# Patient Record
Sex: Female | Born: 1965
Health system: Southern US, Community
[De-identification: ages and names within clinical notes are randomized; demographics above are authoritative.]

## PROBLEM LIST (undated history)

## (undated) DIAGNOSIS — J309 Allergic rhinitis, unspecified: Secondary | ICD-10-CM

## (undated) DIAGNOSIS — R55 Syncope and collapse: Secondary | ICD-10-CM

## (undated) DIAGNOSIS — E039 Hypothyroidism, unspecified: Secondary | ICD-10-CM

## (undated) HISTORY — DX: Hypothyroidism, unspecified: E03.9

## (undated) HISTORY — DX: Syncope and collapse: R55

## (undated) HISTORY — PX: BREAST EXCISIONAL BIOPSY: SUR124

## (undated) HISTORY — DX: Allergic rhinitis, unspecified: J30.9

---
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
## 1997-10-13 ENCOUNTER — Inpatient Hospital Stay (HOSPITAL_COMMUNITY): Admission: AD | Admit: 1997-10-13 | Discharge: 1997-10-15 | Payer: Self-pay | Admitting: Obstetrics and Gynecology

## 1997-11-27 ENCOUNTER — Other Ambulatory Visit: Admission: RE | Admit: 1997-11-27 | Discharge: 1997-11-27 | Payer: Self-pay | Admitting: Obstetrics and Gynecology

## 1998-02-18 ENCOUNTER — Encounter (HOSPITAL_COMMUNITY)
Admission: RE | Admit: 1998-02-18 | Discharge: 1998-05-19 | Payer: Self-pay | Admitting: Physical Medicine & Rehabilitation

## 1999-05-27 ENCOUNTER — Other Ambulatory Visit: Admission: RE | Admit: 1999-05-27 | Discharge: 1999-05-27 | Payer: Self-pay | Admitting: Obstetrics and Gynecology

## 2000-12-22 ENCOUNTER — Other Ambulatory Visit: Admission: RE | Admit: 2000-12-22 | Discharge: 2000-12-22 | Payer: Self-pay | Admitting: Obstetrics and Gynecology

## 2002-02-20 ENCOUNTER — Other Ambulatory Visit: Admission: RE | Admit: 2002-02-20 | Discharge: 2002-02-20 | Payer: Self-pay | Admitting: Obstetrics and Gynecology

## 2002-08-20 ENCOUNTER — Inpatient Hospital Stay (HOSPITAL_COMMUNITY): Admission: AD | Admit: 2002-08-20 | Discharge: 2002-08-24 | Payer: Self-pay | Admitting: *Deleted

## 2002-09-27 ENCOUNTER — Encounter: Admission: RE | Admit: 2002-09-27 | Discharge: 2003-02-04 | Payer: Self-pay | Admitting: Obstetrics and Gynecology

## 2002-10-14 ENCOUNTER — Other Ambulatory Visit: Admission: RE | Admit: 2002-10-14 | Discharge: 2002-10-14 | Payer: Self-pay | Admitting: Obstetrics and Gynecology

## 2003-11-13 ENCOUNTER — Other Ambulatory Visit: Admission: RE | Admit: 2003-11-13 | Discharge: 2003-11-13 | Payer: Self-pay | Admitting: Obstetrics and Gynecology

## 2004-06-03 ENCOUNTER — Other Ambulatory Visit: Admission: RE | Admit: 2004-06-03 | Discharge: 2004-06-03 | Payer: Self-pay | Admitting: Obstetrics and Gynecology

## 2004-12-07 ENCOUNTER — Other Ambulatory Visit: Admission: RE | Admit: 2004-12-07 | Discharge: 2004-12-07 | Payer: Self-pay | Admitting: Obstetrics and Gynecology

## 2005-06-06 ENCOUNTER — Encounter: Admission: RE | Admit: 2005-06-06 | Discharge: 2005-06-06 | Payer: Self-pay | Admitting: Obstetrics and Gynecology

## 2008-08-27 ENCOUNTER — Ambulatory Visit: Payer: Self-pay | Admitting: Internal Medicine

## 2009-05-26 ENCOUNTER — Encounter: Admission: RE | Admit: 2009-05-26 | Discharge: 2009-05-26 | Payer: Self-pay | Admitting: Obstetrics and Gynecology

## 2010-08-20 ENCOUNTER — Other Ambulatory Visit: Payer: Self-pay | Admitting: Obstetrics and Gynecology

## 2010-08-20 DIAGNOSIS — Z1239 Encounter for other screening for malignant neoplasm of breast: Secondary | ICD-10-CM

## 2010-09-13 ENCOUNTER — Ambulatory Visit
Admission: RE | Admit: 2010-09-13 | Discharge: 2010-09-13 | Disposition: A | Discharge status: Home / Self Care | Payer: 59 | Source: Ambulatory Visit | Attending: Obstetrics and Gynecology | Admitting: Obstetrics and Gynecology

## 2010-09-13 DIAGNOSIS — Z1239 Encounter for other screening for malignant neoplasm of breast: Secondary | ICD-10-CM

## 2010-09-28 ENCOUNTER — Institutional Professional Consult (permissible substitution): Payer: Self-pay | Admitting: Internal Medicine

## 2010-11-11 ENCOUNTER — Ambulatory Visit (INDEPENDENT_AMBULATORY_CARE_PROVIDER_SITE_OTHER): Payer: 59 | Admitting: Internal Medicine

## 2010-11-11 ENCOUNTER — Encounter: Payer: Self-pay | Admitting: Internal Medicine

## 2010-11-11 DIAGNOSIS — J45909 Unspecified asthma, uncomplicated: Secondary | ICD-10-CM

## 2010-11-11 DIAGNOSIS — J309 Allergic rhinitis, unspecified: Secondary | ICD-10-CM

## 2010-11-11 MED ORDER — BECLOMETHASONE DIPROPIONATE 40 MCG/ACT IN AERS: 2.0000 | INHALATION_SPRAY | Freq: Two times a day (BID) | RESPIRATORY_TRACT | Status: AC

## 2010-11-11 NOTE — Patient Instructions (Addendum)
Add Qvar 40 - script sent to drug store  Ok to continue using Ventolin HFA as a rescue inhaler as you have been, as needed.   Orders-   Schedule PFT

## 2010-11-11 NOTE — Progress Notes (Signed)
  Subjective:    Patient ID: Latoya Robbins, female    DOB: 01/22/1966, 45 y.o.   MRN: [id number]  HPI 79 yoF, never smoker, seen on referral from Dr Carolee Rota office to establish here for help with asthma. In the past she was under the care of Dr Delano Metz here. Asthma in childhood and was on allergy vaccine. She describes getting progressively with each pregnancy- last was 8 years ago. She has always kept one of her children's rescue inhalers around. After a bad chest cold in February she began needing to use it 1-2x/ night and tends to feel tight as she lies down. Spring pollen is causing more nasal congestion and drainage this year than usual, especially after mowing yard. Triggers also include damp/ musty areas. Home is not damp, does have short haired dog.  CXR 08/24/10 reported clear.  Works as Pensions consultant. Children with first husband have asthma. Step children do not.  Review of Systems Constitutional:   No weight loss, night sweats,  Fevers, chills, fatigue, lassitude. HEENT:   No headaches,  Difficulty swallowing,  Tooth/dental problems,  Sore throat,                CV:  No chest pain,  Orthopnea, PND, swelling in lower extremities, anasarca, dizziness, palpitations  GI  No heartburn, indigestion, abdominal pain, nausea, vomiting, diarrhea, change in bowel habits, loss of appetite  Resp: No shortness of breath with exertion or at rest.  No excess mucus, no productive cough,  No non-productive cough,  No coughing up of blood.  No change in color of mucus.   Skin: no rash or lesions.  GU: no dysuria, change in color of urine, no urgency or frequency.  No flank pain.  MS:  No joint pain or swelling.  No decreased range of motion.  No back pain.  Psych:  No change in mood or affect. No depression or anxiety.  No memory loss.      Objective:   Physical Exam General- Alert, Oriented, Affect-appropriate, Distress- none acute   Tall, thin  Skin- rash-none, lesions- none, excoriation-  none  Lymphadenopathy- none  Head- atraumatic  Eyes- Gross vision intact, PERRLA, conjunctivae clear secretions  Ears- Hearing Normal canals, Tm L ,   R ,  Nose- Clear, No-Septal dev, mucus, polyps, erosion, perforation   Throat- Mallampati II , mucosa clear , drainage- none, tonsils- atrophic  Neck- flexible , trachea midline, no stridor , thyroid nl, carotid no bruit  Chest - symmetrical excursion , unlabored     Heart/CV- RRR , no murmur , no gallop  , no rub, nl s1 s2                     - JVD- none , edema- none, stasis changes- none, varices- none     Lung- clear to P&A, wheeze- none, cough- none , dullness-none, rub- none     Chest wall-   Abd- tender-no, distended-no, bowel sounds-present, HSM- no  Br/ Gen/ Rectal- Not done, not indicated  Extrem- cyanosis- none, clubbing, none, atrophy- none, strength- nl  Neuro- grossly intact to observation          Assessment & Plan:

## 2010-11-14 ENCOUNTER — Encounter: Payer: Self-pay | Admitting: Internal Medicine

## 2010-11-14 DIAGNOSIS — J45909 Unspecified asthma, uncomplicated: Secondary | ICD-10-CM | POA: Insufficient documentation

## 2010-11-14 DIAGNOSIS — J302 Other seasonal allergic rhinitis: Secondary | ICD-10-CM | POA: Insufficient documentation

## 2010-11-14 NOTE — Assessment & Plan Note (Signed)
Mild seasonal rhinitis. An otc antihistamine should be sufficient. Some education on environmental controls and precautions.

## 2010-11-14 NOTE — Assessment & Plan Note (Addendum)
Mild persistent asthma. Trigger hx suggests allergic and non-allergic factors. We discussed rescue and maintenance therapies. A PFT will help characterize her better We will start a trial of Qvar to discuss at next visit. I don't get a significant hx of postnasal drip. GERD or tobacco exposure.

## 2010-11-17 ENCOUNTER — Encounter: Payer: Self-pay | Admitting: Internal Medicine

## 2010-12-07 NOTE — Assessment & Plan Note (Signed)
[facility]                         ELECTROPHYSIOLOGY OFFICE NOTE   Robbins, Latoya Robbins                    MRN:          [id number]  DATE:08/27/2008                            DOB:          10/06/1965    Latoya Robbins was referred today by Dr. Candice Camp for evaluation of  syncope.   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  The  patient has a history of syncope dating back to her teenage years.  She  notes that most commonly these episodes have been associated with her  menstrual cycle and that cramps and abdominal fullness, particularly in  association with urination or defecation, would be more likely to lead  to episodes of passing out.  She has never injured herself passing out  and it sounds like she has had at least a dozen episodes in the last 20-  25 years.  At other times, stressful situations like prior to the  examination for example when she sat for the bar exam and when she took  the PSAT exam, resulted in periods of syncope or near-syncope.  The  patient states that when these episodes occur, she will feel hot and  warm and get somewhat sweaty.  She feels lightheaded and weak.  Typically, she will lie down and sometimes she can avoid total syncope,  although these episodes may last for 5 or 10 minutes.  She has had 1  episode while driving where she avoided passing out completely by  stopping her car and eating some crackers.  She has never had an episode  of bowel or bladder incontinence and she does not bite her tongue when  she has these episodes.  She also notes to have an occasional episode  when she goes to church.   PAST MEDICAL HISTORY:  Notable for lump removed in her left breast in  1988.   SOCIAL HISTORY:  The patient is married.  She has 3 children.  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   FAMILY HISTORY:  Notable for her  mother who is alive and well with no  significant problems.  Her father died at age 2 after a suicide.  She  has a brother and a sister who are alive and well, though one of her  sisters has diabetes.   CURRENT MEDICATIONS:  Oral contraceptive pill.   ALLERGIES:  There is a questionable allergy to PHENERGAN.   REVIEW OF SYSTEMS:  Notable for some seasonal allergic rhinitis, but  otherwise all systems reviewed negative, except as noted in the HPI.   PHYSICAL EXAMINATION:  GENERAL:  She is a pleasant well-appearing woman  in no acute distress.  VITAL SIGNS:  Her blood pressure in the supine position today was 100/60  with a heart rate of 65.  Sitting, her blood pressure was 101/65 with a  heart rate of 68.  After several minutes of standing, the patient became  diaphoretic and felt clammy.  Her heart rate increased to 92 beats per  minute.  Her  blood pressure dropped into the 80s and she felt as though  she was about to pass out.  She laid back down with resolution of her  symptoms today.  Her respirations were 16 and the weight was 137 pounds.  HEENT:  Normocephalic and atraumatic.  Pupils are equal and round.  The  oropharynx is moist.  The sclerae are anicteric.  NECK:  No jugular venous distention.  There is no thyromegaly.  Trachea  was midline.  The carotids were 2+ and symmetric.  LUNGS:  Clear  bilaterally to auscultation.  There were no wheezes, rales, or rhonchi.  There is no increased work of breathing.  CARDIOVASCULAR:  Regular rate and rhythm.  Normal S1 and S2.  There are  no murmurs, rubs, or gallops.  PMI was not enlarged nor was it laterally  displaced.  ABDOMEN:  Soft, nontender, and nondistended.  There is no organomegaly.  Bowel sounds were present.  There is no rebound or guarding.  EXTREMITIES:  No cyanosis, clubbing, or edema.  The pulses are 2+ and  symmetric.  NEUROLOGIC:  Alert and oriented x3.  The cranial nerves are intact.  Strength is 5/5 and  symmetric.   The EKG demonstrates sinus rhythm with normal axis and intervals.   IMPRESSION:  Recurrent episodes of syncope.   DISCUSSION:  Latoya Robbins today has fairly classic neurally-mediated  syncope.  We talked about the importance of prevention and terms of  sitting or lying down when she feels an episode coming on.  We also  talked about the importance of increasing the fluid and the salt intake  and she notes already that with increasing her protein intake  recommended by her gynecologist that her symptoms has improved, but  still occasionally present.  I have also talked about the importance of  increasing her fluid intake.  The dietary history today sounds like the  patient is not missing meals and I have encouraged her on the importance  of not missing meals and how important it is to maintain her body fluid  intake, particularly when she is exerting herself.  If initial  conservative measures in controlling her symptoms are unsuccessful, then  we would certainly consider adding drug like Florinef to help maintain  or increase her fluid volume.  I will plan to see the patient back as  needed.     Doylene Canning. Ladona Ridgel, MD  Electronically Signed    GWT/MedQ  DD: 08/27/2008  DT: 08/28/2008  Job #: [id number]   cc:   Dineen Kid. Rana Snare, M.D.  Soyla Murphy. Renne Crigler, M.D.

## 2010-12-07 NOTE — Letter (Signed)
August 27, 2008    Dineen Kid. Rana Snare, MD  [address], [address]  [place], [place] [address]   RE:  NKENGE, SONNTAG  MRN:  [id number]  /  DOB:  [birth year]   Dear Dr. Rana Snare:   Today, I saw a mutual patient of ours, Latoya Robbins which you very  nicely referred for evaluation of syncope.  The patient know that the  patient has 3 children with fairly uncomplicated pregnancies by her  report but has had a long history of syncope.  Today in our office, the  patient actually had a near-syncopal episode while undergoing  orthostatic blood pressure measurements.  Her history is fairly classic  for her neurally mediated syncope.   Today, we talked about the importance of preventive measures to reduce  the frequency of her episodes, specifically increasing her salt and  fluid intake and not missing meals and making sure she gets plenty of  protein which I think you had already mentioned to her before.  I do not  think these episodes have anything to do with hypoglycemia but are  fairly classic for neurally mediated syncope and that they typically  occur during her monthly menstrual cycle, have occurred at times when  she is giving blood or during stressful situations prior to different  examinations and also occurred when she has been in church.  I have  discussed the relative benign nature of her problem, also that if  initial conservative measures by increasing salts and fluid are  unsuccessful in reducing her symptoms and consideration for her  medication like Florinef or a vasoactive drug would be a consideration  for her therapy.  I typically have not had much success with these  patients with beta-blockers, though occasionally some will respond,  and I have also not had as much success with centrally acting drugs like  Lexapro or Zoloft.  I will plan to see her back as needed.   Thanks again for referring her over.    Sincerely,      Doylene Canning. Ladona Ridgel, MD  Electronically  Signed    GWT/MedQ  DD: 08/27/2008  DT: 08/28/2008  Job #: [id number]

## 2010-12-10 NOTE — Discharge Summary (Signed)
NAME:  Latoya Robbins, Latoya Robbins                       ACCOUNT NO.:  [id number]   MEDICAL RECORD NO.:  [id number]                   PATIENT TYPE:  INP   LOCATION:  9110                                 FACILITY:  [facility]   PHYSICIAN:  Dineen Kid. Rana Snare, M.D.                 DATE OF BIRTH:  [birth year]   DATE OF ADMISSION:  08/20/2002  DATE OF DISCHARGE:  08/24/2002                                 DISCHARGE SUMMARY   ADMISSION DIAGNOSES:  1. Intrauterine pregnancy at term.  2. Spontaneous rupture of membranes.   DISCHARGE DIAGNOSES:  1. Status post spontaneous vaginal delivery.  2. Viable female infant.  3. Endometritis - resolving.   PROCEDURE:  Spontaneous vaginal delivery.   REASON FOR ADMISSION:  Please see written H&P.   HOSPITAL COURSE:  The patient was a 45 year old married white female gravida  4, para 2 who presented to [facility] with spontaneous  rupture of membranes during the previous night.  Contractions were  irregular.  Cervix was noted to be 3 to 4 cm dilated, 90% effaced, vertex at  a 0 station.  Group B beta strep culture was negative.  The patient  progressed appropriately and delivered via spontaneous vaginal delivery, a  viable female infant with a second degree perineal laceration.  Baby had  decreased tone.  Neonatal intensive care unit team was called.  The baby  responded quickly to oxygen therapy.  Cord pH was 7.17.  Apgars were 6 at  one minute, 9 at five minutes and 9 at 10 minutes.  The baby was taken to  the newborn nursery.  Mother did well postpartally and was transferred to  the mother-baby unit in stable condition.  On admission to the mother-baby  unit, maternal temperature was elevated to 101.2 degrees F.  IV antibiotics  were administered.   On postpartum day #1, the patient was afebrile.  Fundus was firm and  nontender.  Labs revealed hemoglobin of 10.8, platelet count 160,000, white  blood cell count 13.5.   On postpartum day #2, vital  signs were stable.  The patient was afebrile.  Fundus was mildly tender.  IV antibiotics were discontinued and the patient  was started on oral antibiotics.  The patient was kept overnight for  observation.   On postoperative day #3, the fundus was firm and nontender.  The patient was  afebrile.  Discharge instructions were reviewed and the patient was  discharge home.   CONDITION ON DISCHARGE:  Good.   DIET:  Increase fluids and increase protein per breast-feeding protocol.   ACTIVITY:  The patient is to resume  normal activity except no vaginal  entry, douches, tampons or sexual activity.   FOLLOW UP:  The patient is to follow up in the OB office in four to six  weeks for her postpartum exam.  She is encouraged to walk two to three times  per week.  DISCHARGE MEDICATIONS:  1. Motrin 600 mg one q.6h. p.r.n.  2. Percocet 5/325 #30 one p.o. q.4-6h. p.r.n.  3. Keflex 500 mg #31 on p.o. t.i.d. x7 days.  4. Prenatal vitamins one p.o. daily.     Julio Sicks, N.P.                        Dineen Kid Rana Snare, M.D.    CC/MEDQ  D:  09/23/2002  T:  09/23/2002  Job:  [id number]

## 2015-07-28 ENCOUNTER — Encounter (HOSPITAL_COMMUNITY): Payer: Self-pay | Admitting: Emergency Medicine

## 2015-07-28 ENCOUNTER — Emergency Department (HOSPITAL_COMMUNITY)
Admission: EM | Admit: 2015-07-28 | Discharge: 2015-07-28 | Disposition: A | Discharge status: Home / Self Care | Payer: 59 | Attending: Emergency Medicine | Admitting: Emergency Medicine

## 2015-07-28 DIAGNOSIS — Z880 Allergy status to penicillin: Secondary | ICD-10-CM | POA: Diagnosis not present

## 2015-07-28 DIAGNOSIS — Y998 Other external cause status: Secondary | ICD-10-CM | POA: Diagnosis not present

## 2015-07-28 DIAGNOSIS — S6992XA Unspecified injury of left wrist, hand and finger(s), initial encounter: Secondary | ICD-10-CM | POA: Diagnosis present

## 2015-07-28 DIAGNOSIS — J45909 Unspecified asthma, uncomplicated: Secondary | ICD-10-CM | POA: Diagnosis not present

## 2015-07-28 DIAGNOSIS — S61217A Laceration without foreign body of left little finger without damage to nail, initial encounter: Secondary | ICD-10-CM | POA: Insufficient documentation

## 2015-07-28 DIAGNOSIS — Y9389 Activity, other specified: Secondary | ICD-10-CM | POA: Diagnosis not present

## 2015-07-28 DIAGNOSIS — Z7951 Long term (current) use of inhaled steroids: Secondary | ICD-10-CM | POA: Diagnosis not present

## 2015-07-28 DIAGNOSIS — Z793 Long term (current) use of hormonal contraceptives: Secondary | ICD-10-CM | POA: Insufficient documentation

## 2015-07-28 DIAGNOSIS — W260XXA Contact with knife, initial encounter: Secondary | ICD-10-CM | POA: Diagnosis not present

## 2015-07-28 DIAGNOSIS — Z79899 Other long term (current) drug therapy: Secondary | ICD-10-CM | POA: Diagnosis not present

## 2015-07-28 DIAGNOSIS — Y9289 Other specified places as the place of occurrence of the external cause: Secondary | ICD-10-CM | POA: Insufficient documentation

## 2015-07-28 DIAGNOSIS — S61219A Laceration without foreign body of unspecified finger without damage to nail, initial encounter: Secondary | ICD-10-CM

## 2015-07-28 NOTE — ED Provider Notes (Signed)
History  By signing my name below, I, Latoya Robbins, attest that this documentation has been prepared under the direction and in the presence of [employer], PA-C. Electronically Signed: Marlowe Robbins, ED Scribe. 07/28/2015. 10:31 PM.  Chief Complaint  Patient presents with  . Extremity Laceration   The history is provided by the patient and medical records. No language interpreter was used.    HPI Comments:  Latoya Robbins is a 50 y.o. female who presents to the Emergency Department complaining of a laceration to the left fifth digit that she sustained while emptying the dishwasher and came into contact with a knife approximately three hours ago. Pt reports associated bleeding that has since resolved. She reports wrapping the wound with gauze and applying pressure. She denies modifying factors. She denies numbness, tingling or weakness of the left fifth digit or left hand. She is unsure of her last tetanus vaccination but believes it was within the past 5 years. She has an appt for her yearly physical in two days and will inquire then. She is left-hand dominant.   Past Medical History  Diagnosis Date  . Asthma    Past Surgical History  Procedure Laterality Date  . Breast surgery  1983    benign nodule   Family History  Problem Relation Age of Onset  . Heart disease Other     uncle  . Asthma    . Uterine cancer      grandmother  . Diabetes Sister     aunt,uncle   Social History  Substance Use Topics  . Smoking status: Never Smoker   . Smokeless tobacco: None  . Alcohol Use: No   OB History    No data available     Review of Systems  Skin: Positive for wound.  Neurological: Negative for numbness.  All other systems reviewed and are negative.   Allergies  Amoxicillin and Promethazine hcl  Home Medications   Prior to Admission medications   Medication Sig Start Date End Date Taking? Authorizing Provider  cetirizine (ZYRTEC) 10 MG tablet Take 10 mg by  mouth daily.   Yes Historical Provider, MD  mometasone-formoterol (DULERA) 100-5 MCG/ACT AERO Inhale 2 puffs into the lungs 2 (two) times daily.   Yes Historical Provider, MD  montelukast (SINGULAIR) 10 MG tablet Take 10 mg by mouth at bedtime.   Yes Historical Provider, MD  Norethin-Eth Estradiol-Fe [facility] FE PO) Take 1 tablet by mouth daily.     Yes Historical Provider, MD  albuterol (VENTOLIN HFA) 108 (90 BASE) MCG/ACT inhaler Inhale 2 puffs into the lungs 4 (four) times daily.      Historical Provider, MD  beclomethasone (QVAR) 40 MCG/ACT inhaler Inhale 2 puffs into the lungs 2 (two) times daily. And rinse mouth 11/11/10 11/11/11  Deneise Lever, MD   Triage Vitals: BP 128/56 mmHg  Pulse 71  Temp(Src) 97.7 F (36.5 C) (Oral)  Resp 16  Ht 5' 9.5" (1.765 m)  Wt 135 lb (61.236 kg)  BMI 19.66 kg/m2  SpO2 96%  Physical Exam  Constitutional: She is oriented to person, place, and time. She appears well-developed and well-nourished. No distress.  Nontoxic/nonseptic appearing  HENT:  Head: Normocephalic and atraumatic.  Eyes: Conjunctivae and EOM are normal. No scleral icterus.  Neck: Normal range of motion.  Cardiovascular: Normal rate, regular rhythm and intact distal pulses.   Distal radial pulse 2+ in the left upper extremity. Capillary refill brisk in all digits of left hand.  Pulmonary/Chest: Effort normal.  No respiratory distress.  Musculoskeletal: Normal range of motion.       Left hand: She exhibits laceration. She exhibits normal range of motion, no bony tenderness, normal capillary refill and no swelling. Normal sensation noted. Normal strength noted.       Hands: Neurological: She is alert and oriented to person, place, and time. She exhibits normal muscle tone. Coordination normal.  Sensation to light touch intact. Patient able to wiggle all fingers.  Skin: Skin is warm and dry. No rash noted. She is not diaphoretic. No erythema. No pallor.  0.5 cm superficial laceration  noted to the distal lateral left fifth digit  Psychiatric: She has a normal mood and affect. Her behavior is normal.  Nursing note and vitals reviewed.   ED Course  Procedures (including critical care time) DIAGNOSTIC STUDIES: Oxygen Saturation is 96% on RA, adequate by my interpretation.   COORDINATION OF CARE: 10:28 PM- Gave the pt the option of having a suture placed or having the wound glued. She chose to have the laceration closed with DermaBond. Will follow up with PCP about tetanus vaccination. Pt verbalizes understanding and agrees to plan.  LACERATION REPAIR PROCEDURE NOTE The patient's identification was confirmed and consent was obtained. This procedure was performed by Antonietta Breach, PA-C at 10:30 PM. Site: left fifth digit Sterile procedures observed Anesthetic used (type and amt): none Suture type/size: n/a Length: 0.5 cm # of Sutures: n/a Technique: DermaBond Complexity: simple Antibx ointment applied Tetanus: will follow up with PCP in two days Site anesthetized, irrigated with NS, explored without evidence of foreign body, wound well approximated, site covered with dry, sterile dressing.  Patient tolerated procedure well without complications. Instructions for care discussed verbally and patient provided with additional written instructions for homecare and f/u.  Medications - No data to display   10:46 PM Patient declines tetanus; states she believes it's UTD, within the last 5 years, and wants to confirm with PCP.  MDM   Final diagnoses:  Laceration of finger of left hand, initial encounter    Tdap booster UTD, per patient. Laceration occurred < 8 hours prior to repair which was well tolerated. Pt has no comorbidities to effect normal wound healing. Discussed suture home care with pt and answered questions. Pt to follow up for wound check with PCP PRN. Pt is hemodynamically stable with no complaints prior to discharge.    I personally performed the  services described in this documentation, which was scribed in my presence. The recorded information has been reviewed and is accurate.    Filed Vitals:   07/28/15 2209  BP: 128/56  Pulse: 71  Temp: 97.7 F (36.5 C)  TempSrc: Oral  Resp: 16  Height: 5' 9.5" (1.765 m)  Weight: 61.236 kg  SpO2: 96%      Antonietta Breach, PA-C 07/28/15 [place], MD 07/30/15 2006

## 2015-07-28 NOTE — ED Notes (Signed)
EDP at bedside  

## 2015-07-28 NOTE — Discharge Instructions (Signed)
Keep wound covered for the next few days. Do not soak your hands in water for 3-4 days. Take ibuprofen or tylenol as needed for pain. Follow up with your primary care doctor as needed for wound recheck.  Laceration Care, Adult A laceration is a cut that goes through all layers of the skin. The cut also goes into the tissue that is right under the skin. Some cuts heal on their own. Others need to be closed with stitches (sutures), staples, skin adhesive strips, or wound glue. Taking care of your cut lowers your risk of infection and helps your cut to heal better. HOW TO TAKE CARE OF YOUR CUT For stitches or staples:  Keep the wound clean and dry.  If you were given a bandage (dressing), you should change it at least one time per day or as told by your doctor. You should also change it if it gets wet or dirty.  Keep the wound completely dry for the first 24 hours or as told by your doctor. After that time, you may take a shower or a bath. However, make sure that the wound is not soaked in water until after the stitches or staples have been removed.  Clean the wound one time each day or as told by your doctor:  Wash the wound with soap and water.  Rinse the wound with water until all of the soap comes off.  Pat the wound dry with a clean towel. Do not rub the wound.  After you clean the wound, put a thin layer of antibiotic ointment on it as told by your doctor. This ointment:  Helps to prevent infection.  Keeps the bandage from sticking to the wound.  Have your stitches or staples removed as told by your doctor. If your doctor used skin adhesive strips:   Keep the wound clean and dry.  If you were given a bandage, you should change it at least one time per day or as told by your doctor. You should also change it if it gets dirty or wet.  Do not get the skin adhesive strips wet. You can take a shower or a bath, but be careful to keep the wound dry.  If the wound gets wet, pat it dry  with a clean towel. Do not rub the wound.  Skin adhesive strips fall off on their own. You can trim the strips as the wound heals. Do not remove any strips that are still stuck to the wound. They will fall off after a while. If your doctor used wound glue:  Try to keep your wound dry, but you may briefly wet it in the shower or bath. Do not soak the wound in water, such as by swimming.  After you take a shower or a bath, gently pat the wound dry with a clean towel. Do not rub the wound.  Do not do any activities that will make you really sweaty until the skin glue has fallen off on its own.  Do not apply liquid, cream, or ointment medicine to your wound while the skin glue is still on.  If you were given a bandage, you should change it at least one time per day or as told by your doctor. You should also change it if it gets dirty or wet.  If a bandage is placed over the wound, do not let the tape for the bandage touch the skin glue.  Do not pick at the glue. The skin glue usually stays  on for 5-10 days. Then, it falls off of the skin. General Instructions  To help prevent scarring, make sure to cover your wound with sunscreen whenever you are outside after stitches are removed, after adhesive strips are removed, or when wound glue stays in place and the wound is healed. Make sure to wear a sunscreen of at least 30 SPF.  Take over-the-counter and prescription medicines only as told by your doctor.  If you were given antibiotic medicine or ointment, take or apply it as told by your doctor. Do not stop using the antibiotic even if your wound is getting better.  Do not scratch or pick at the wound.  Keep all follow-up visits as told by your doctor. This is important.  Check your wound every day for signs of infection. Watch for:  Redness, swelling, or pain.  Fluid, blood, or pus.  Raise (elevate) the injured area above the level of your heart while you are sitting or lying down, if  possible. GET HELP IF:  You got a tetanus shot and you have any of these problems at the injection site:  Swelling.  Very bad pain.  Redness.  Bleeding.  You have a fever.  A wound that was closed breaks open.  You notice a bad smell coming from your wound or your bandage.  You notice something coming out of the wound, such as wood or glass.  Medicine does not help your pain.  You have more redness, swelling, or pain at the site of your wound.  You have fluid, blood, or pus coming from your wound.  You notice a change in the color of your skin near your wound.  You need to change the bandage often because fluid, blood, or pus is coming from the wound.  You start to have a new rash.  You start to have numbness around the wound. GET HELP RIGHT AWAY IF:  You have very bad swelling around the wound.  Your pain suddenly gets worse and is very bad.  You notice painful lumps near the wound or on skin that is anywhere on your body.  You have a red streak going away from your wound.  The wound is on your hand or foot and you cannot move a finger or toe like you usually can.  The wound is on your hand or foot and you notice that your fingers or toes look pale or bluish.   This information is not intended to replace advice given to you by your health care provider. Make sure you discuss any questions you have with your health care provider.   Document Released: 12/28/2007 Document Revised: 11/25/2014 Document Reviewed: 07/07/2014 Elsevier Interactive Patient Education [employer].

## 2015-07-28 NOTE — ED Notes (Signed)
Pt states she cut her left pinky finger tonight on a knife while unloading the dishwasher

## 2015-07-28 NOTE — ED Notes (Signed)
Dermabond at bedside.  

## 2015-11-09 ENCOUNTER — Encounter: Payer: Self-pay | Admitting: Internal Medicine

## 2016-09-06 DIAGNOSIS — Z0000 Encounter for general adult medical examination without abnormal findings: Secondary | ICD-10-CM | POA: Diagnosis not present

## 2016-09-13 DIAGNOSIS — L905 Scar conditions and fibrosis of skin: Secondary | ICD-10-CM | POA: Diagnosis not present

## 2016-09-13 DIAGNOSIS — E038 Other specified hypothyroidism: Secondary | ICD-10-CM | POA: Diagnosis not present

## 2016-09-13 DIAGNOSIS — J302 Other seasonal allergic rhinitis: Secondary | ICD-10-CM | POA: Diagnosis not present

## 2016-09-13 DIAGNOSIS — H6122 Impacted cerumen, left ear: Secondary | ICD-10-CM | POA: Diagnosis not present

## 2016-09-13 DIAGNOSIS — J45909 Unspecified asthma, uncomplicated: Secondary | ICD-10-CM | POA: Diagnosis not present

## 2016-09-13 DIAGNOSIS — Z0000 Encounter for general adult medical examination without abnormal findings: Secondary | ICD-10-CM | POA: Diagnosis not present

## 2016-09-14 DIAGNOSIS — Z1212 Encounter for screening for malignant neoplasm of rectum: Secondary | ICD-10-CM | POA: Diagnosis not present

## 2016-12-01 DIAGNOSIS — L814 Other melanin hyperpigmentation: Secondary | ICD-10-CM | POA: Diagnosis not present

## 2016-12-01 DIAGNOSIS — D225 Melanocytic nevi of trunk: Secondary | ICD-10-CM | POA: Diagnosis not present

## 2016-12-01 DIAGNOSIS — D2262 Melanocytic nevi of left upper limb, including shoulder: Secondary | ICD-10-CM | POA: Diagnosis not present

## 2016-12-13 DIAGNOSIS — J4540 Moderate persistent asthma, uncomplicated: Secondary | ICD-10-CM | POA: Diagnosis not present

## 2016-12-13 DIAGNOSIS — Z01419 Encounter for gynecological examination (general) (routine) without abnormal findings: Secondary | ICD-10-CM | POA: Diagnosis not present

## 2016-12-13 DIAGNOSIS — J301 Allergic rhinitis due to pollen: Secondary | ICD-10-CM | POA: Diagnosis not present

## 2016-12-13 DIAGNOSIS — Z1231 Encounter for screening mammogram for malignant neoplasm of breast: Secondary | ICD-10-CM | POA: Diagnosis not present

## 2016-12-13 DIAGNOSIS — J3089 Other allergic rhinitis: Secondary | ICD-10-CM | POA: Diagnosis not present

## 2016-12-16 DIAGNOSIS — N920 Excessive and frequent menstruation with regular cycle: Secondary | ICD-10-CM | POA: Diagnosis not present

## 2016-12-16 DIAGNOSIS — Z780 Asymptomatic menopausal state: Secondary | ICD-10-CM | POA: Diagnosis not present

## 2017-09-21 DIAGNOSIS — E038 Other specified hypothyroidism: Secondary | ICD-10-CM | POA: Diagnosis not present

## 2017-09-25 DIAGNOSIS — Z1389 Encounter for screening for other disorder: Secondary | ICD-10-CM | POA: Diagnosis not present

## 2017-09-25 DIAGNOSIS — R5383 Other fatigue: Secondary | ICD-10-CM | POA: Diagnosis not present

## 2017-09-25 DIAGNOSIS — E038 Other specified hypothyroidism: Secondary | ICD-10-CM | POA: Diagnosis not present

## 2017-09-25 DIAGNOSIS — J302 Other seasonal allergic rhinitis: Secondary | ICD-10-CM | POA: Diagnosis not present

## 2017-09-25 DIAGNOSIS — J45909 Unspecified asthma, uncomplicated: Secondary | ICD-10-CM | POA: Diagnosis not present

## 2017-09-25 DIAGNOSIS — Z0000 Encounter for general adult medical examination without abnormal findings: Secondary | ICD-10-CM | POA: Diagnosis not present

## 2017-09-27 DIAGNOSIS — Z1212 Encounter for screening for malignant neoplasm of rectum: Secondary | ICD-10-CM | POA: Diagnosis not present

## 2017-11-22 DIAGNOSIS — H5213 Myopia, bilateral: Secondary | ICD-10-CM | POA: Diagnosis not present

## 2017-11-22 DIAGNOSIS — H43813 Vitreous degeneration, bilateral: Secondary | ICD-10-CM | POA: Diagnosis not present

## 2017-12-01 DIAGNOSIS — D2271 Melanocytic nevi of right lower limb, including hip: Secondary | ICD-10-CM | POA: Diagnosis not present

## 2017-12-01 DIAGNOSIS — D225 Melanocytic nevi of trunk: Secondary | ICD-10-CM | POA: Diagnosis not present

## 2017-12-01 DIAGNOSIS — D2262 Melanocytic nevi of left upper limb, including shoulder: Secondary | ICD-10-CM | POA: Diagnosis not present

## 2017-12-01 DIAGNOSIS — D2261 Melanocytic nevi of right upper limb, including shoulder: Secondary | ICD-10-CM | POA: Diagnosis not present

## 2018-02-05 DIAGNOSIS — Z01419 Encounter for gynecological examination (general) (routine) without abnormal findings: Secondary | ICD-10-CM | POA: Diagnosis not present

## 2018-02-05 DIAGNOSIS — Z1231 Encounter for screening mammogram for malignant neoplasm of breast: Secondary | ICD-10-CM | POA: Diagnosis not present

## 2018-02-05 DIAGNOSIS — Z6820 Body mass index (BMI) 20.0-20.9, adult: Secondary | ICD-10-CM | POA: Diagnosis not present

## 2018-02-07 ENCOUNTER — Other Ambulatory Visit: Payer: Self-pay | Admitting: Obstetrics and Gynecology

## 2018-02-07 DIAGNOSIS — R921 Mammographic calcification found on diagnostic imaging of breast: Secondary | ICD-10-CM

## 2018-02-08 DIAGNOSIS — Z780 Asymptomatic menopausal state: Secondary | ICD-10-CM | POA: Diagnosis not present

## 2018-02-12 ENCOUNTER — Ambulatory Visit
Admission: RE | Admit: 2018-02-12 | Discharge: 2018-02-12 | Disposition: A | Discharge status: Home / Self Care | Payer: BLUE CROSS/BLUE SHIELD | Source: Ambulatory Visit | Attending: Obstetrics and Gynecology | Admitting: Obstetrics and Gynecology

## 2018-02-12 ENCOUNTER — Other Ambulatory Visit: Payer: Self-pay | Admitting: Obstetrics and Gynecology

## 2018-02-12 DIAGNOSIS — R921 Mammographic calcification found on diagnostic imaging of breast: Secondary | ICD-10-CM

## 2018-08-16 ENCOUNTER — Other Ambulatory Visit: Payer: Self-pay | Admitting: Obstetrics and Gynecology

## 2018-08-16 ENCOUNTER — Ambulatory Visit
Admission: RE | Admit: 2018-08-16 | Discharge: 2018-08-16 | Disposition: A | Discharge status: Home / Self Care | Payer: BLUE CROSS/BLUE SHIELD | Source: Ambulatory Visit | Attending: Obstetrics and Gynecology | Admitting: Obstetrics and Gynecology

## 2018-08-16 DIAGNOSIS — R921 Mammographic calcification found on diagnostic imaging of breast: Secondary | ICD-10-CM | POA: Diagnosis not present

## 2018-09-07 ENCOUNTER — Emergency Department (HOSPITAL_COMMUNITY): Payer: BLUE CROSS/BLUE SHIELD

## 2018-09-07 ENCOUNTER — Emergency Department (HOSPITAL_COMMUNITY)
Admission: EM | Admit: 2018-09-07 | Discharge: 2018-09-07 | Disposition: A | Discharge status: Home / Self Care | Payer: BLUE CROSS/BLUE SHIELD | Attending: Emergency Medicine | Admitting: Emergency Medicine

## 2018-09-07 ENCOUNTER — Encounter (HOSPITAL_COMMUNITY): Payer: Self-pay | Admitting: Internal Medicine

## 2018-09-07 DIAGNOSIS — I959 Hypotension, unspecified: Secondary | ICD-10-CM | POA: Diagnosis not present

## 2018-09-07 DIAGNOSIS — J45909 Unspecified asthma, uncomplicated: Secondary | ICD-10-CM | POA: Diagnosis not present

## 2018-09-07 DIAGNOSIS — R110 Nausea: Secondary | ICD-10-CM | POA: Insufficient documentation

## 2018-09-07 DIAGNOSIS — R55 Syncope and collapse: Secondary | ICD-10-CM | POA: Diagnosis not present

## 2018-09-07 DIAGNOSIS — R05 Cough: Secondary | ICD-10-CM | POA: Diagnosis not present

## 2018-09-07 LAB — CBC
HCT: 43.3 % (ref 36.0–46.0)
Hemoglobin: 14.5 g/dL (ref 12.0–15.0)
MCH: 31.9 pg (ref 26.0–34.0)
MCHC: 33.5 g/dL (ref 30.0–36.0)
MCV: 95.4 fL (ref 80.0–100.0)
PLATELETS: 299 10*3/uL (ref 150–400)
RBC: 4.54 MIL/uL (ref 3.87–5.11)
RDW: 13.2 % (ref 11.5–15.5)
WBC: 9.8 10*3/uL (ref 4.0–10.5)
nRBC: 0 % (ref 0.0–0.2)

## 2018-09-07 LAB — BASIC METABOLIC PANEL
ANION GAP: 11 (ref 5–15)
BUN: 12 mg/dL (ref 6–20)
CALCIUM: 8.9 mg/dL (ref 8.9–10.3)
CO2: 23 mmol/L (ref 22–32)
Chloride: 105 mmol/L (ref 98–111)
Creatinine, Ser: 0.98 mg/dL (ref 0.44–1.00)
GFR calc Af Amer: >60 mL/min (ref >60)
GLUCOSE: 108 mg/dL — AB (ref 70–99)
Potassium: 3.8 mmol/L (ref 3.5–5.1)
SODIUM: 139 mmol/L (ref 135–145)

## 2018-09-07 LAB — I-STAT BETA HCG BLOOD, ED (MC, WL, AP ONLY)

## 2018-09-07 MED ORDER — OXYMETAZOLINE HCL 0.05 % NA SOLN: 1.0000 | Freq: Two times a day (BID) | NASAL | 0 refills | Status: AC

## 2018-09-07 MED ORDER — SODIUM CHLORIDE 0.9 % IV BOLUS
1000.0000 mL | Freq: Once | INTRAVENOUS | Status: AC
  Administered 2018-09-07: 1000 mL via INTRAVENOUS

## 2018-09-07 MED ORDER — SODIUM CHLORIDE 0.9% FLUSH: 3.0000 mL | Freq: Once | INTRAVENOUS | Status: DC

## 2018-09-07 NOTE — ED Triage Notes (Signed)
Pt BIB [facility] after syncopal episode at a [employer] meeting this afternoon. Hx of vasovagal syndrome. Pt remembers "feeling warm, tucking her head between her legs, and going to the ground." Denies nausea/vomiting/chest pain. Initial BP 98/54. Now 120s/60s. CBG 119 for EMS. HR 80s and regular.

## 2018-09-07 NOTE — ED Notes (Signed)
Patient verbalizes understanding of discharge instructions. Opportunity for questioning and answers were provided. Armband removed by staff, pt discharged from ED.  

## 2018-09-07 NOTE — ED Notes (Signed)
Patient transported to X-ray 

## 2018-09-07 NOTE — Discharge Instructions (Addendum)
Follow-up with your primary care doctor.  Return to the ED symptoms worsen.

## 2018-09-07 NOTE — ED Provider Notes (Addendum)
[facility] EMERGENCY DEPARTMENT Provider Note   CSN: [id number] Arrival date & time: 09/07/18  1345     History   Chief Complaint Chief Complaint  Patient presents with  . Near Syncope    HPI Latoya Robbins is a 53 y.o. female.  The history is provided by the patient.  Near Syncope  This is a recurrent problem. The current episode started 1 to 2 hours ago. The problem has been resolved. Pertinent negatives include no chest pain, no abdominal pain, no headaches and no shortness of breath. Associated symptoms comments: Patient was seating and eating and got warm and flushed and nauseous and almost passed out, hx of the same. Resolved on its hold. BP low with EMS but improved with fluids. Asymptomatic upon my evaluation.  . Nothing aggravates the symptoms. Nothing relieves the symptoms. She has tried nothing for the symptoms. The treatment provided no relief.    Past Medical History:  Diagnosis Date  . Asthma     Patient Active Problem List   Diagnosis Date Noted  . Asthma with allergic rhinitis 11/14/2010  . Allergic rhinitis, seasonal 11/14/2010    Past Surgical History:  Procedure Laterality Date  . BREAST EXCISIONAL BIOPSY Left    at age 20 cyst removed  . BREAST SURGERY  1983   benign nodule     OB History   No obstetric history on file.      Home Medications    Prior to Admission medications   Medication Sig Start Date End Date Taking? Authorizing Provider  albuterol (VENTOLIN HFA) 108 (90 BASE) MCG/ACT inhaler Inhale 2 puffs into the lungs 4 (four) times daily.      [provider]  beclomethasone (QVAR) 40 MCG/ACT inhaler Inhale 2 puffs into the lungs 2 (two) times daily. And rinse mouth 11/11/10 11/11/11  Baird Lyons D, MD  cetirizine (ZYRTEC) 10 MG tablet Take 10 mg by mouth daily.    [provider]  mometasone-formoterol (DULERA) 100-5 MCG/ACT AERO Inhale 2 puffs into the lungs 2 (two) times daily.    [provider]  montelukast (SINGULAIR) 10 MG tablet Take 10 mg by mouth at bedtime.    [provider]  Norethin-Eth Estradiol-Fe [facility] FE PO) Take 1 tablet by mouth daily.      [provider]  oxymetazoline (AFRIN NASAL SPRAY) 0.05 % nasal spray Place 1 spray into both nostrils 2 (two) times daily for 3 days. 09/07/18 09/10/18  Lennice Sites, DO    Family History Family History  Problem Relation Age of Onset  . Heart disease Other        uncle  . Asthma Other   . Uterine cancer Other        grandmother  . Diabetes Sister        aunt,uncle    Social History Social History   Tobacco Use  . Smoking status: Never Smoker  Substance Use Topics  . Alcohol use: No  . Drug use: No     Allergies   Amoxicillin; Azithromycin; and Promethazine hcl   Review of Systems Review of Systems  Constitutional: Negative for chills and fever.  HENT: Positive for congestion and sinus pressure. Negative for ear pain and sore throat.   Eyes: Negative for pain and visual disturbance.  Respiratory: Positive for cough. Negative for shortness of breath.   Cardiovascular: Positive for near-syncope. Negative for chest pain and palpitations.  Gastrointestinal: Negative for abdominal pain and vomiting.  Genitourinary: Negative  for dysuria and hematuria.  Musculoskeletal: Negative for arthralgias and back pain.  Skin: Negative for color change and rash.  Neurological: Negative for seizures, syncope and headaches.  All other systems reviewed and are negative.    Physical Exam Updated Vital Signs  ED Triage Vitals [09/07/18 1350]  Enc Vitals Group     BP 129/66     Pulse Rate 76     Resp 16     Temp 98.4 F (36.9 C)     Temp Source Oral     SpO2 100 %     Weight      Height      Head Circumference      Peak Flow      Pain Score 0     Pain Loc      Pain Edu?      Excl. in [place]?     Physical Exam Vitals signs and nursing note reviewed.  Constitutional:       General: She is not in acute distress.    Appearance: She is well-developed.  HENT:     Head: Normocephalic and atraumatic.     Nose: Nose normal.     Mouth/Throat:     Mouth: Mucous membranes are moist.  Eyes:     Extraocular Movements: Extraocular movements intact.     Conjunctiva/sclera: Conjunctivae normal.     Pupils: Pupils are equal, round, and reactive to light.  Neck:     Musculoskeletal: Normal range of motion and neck supple.  Cardiovascular:     Rate and Rhythm: Normal rate and regular rhythm.     Pulses: Normal pulses.     Heart sounds: Normal heart sounds. No murmur.  Pulmonary:     Effort: Pulmonary effort is normal. No respiratory distress.     Breath sounds: Normal breath sounds.  Abdominal:     General: Abdomen is flat.     Palpations: Abdomen is soft.     Tenderness: There is no abdominal tenderness.  Musculoskeletal: Normal range of motion.     Right lower leg: No edema.     Left lower leg: No edema.  Skin:    General: Skin is warm and dry.     Capillary Refill: Capillary refill takes less than 2 seconds.     Findings: No rash.  Neurological:     General: No focal deficit present.     Mental Status: She is alert.     Cranial Nerves: No cranial nerve deficit.     Sensory: No sensory deficit.     Motor: No weakness.     Coordination: Coordination normal.     Comments: 5+/5 strength, normal sensation, no drift, normal speech, normal finger to nose finger  Psychiatric:        Mood and Affect: Mood normal.      ED Treatments / Results  Labs (all labs ordered are listed, but only abnormal results are displayed) Labs Reviewed  BASIC METABOLIC PANEL - Abnormal; Notable for the following components:      Result Value   Glucose, Bld 108 (*)    All other components within normal limits  CBC  URINALYSIS, ROUTINE W REFLEX MICROSCOPIC  CBG MONITORING, ED  I-STAT BETA HCG BLOOD, ED ([facility], [facility], [facility] ONLY)    EKG EKG Interpretation  Date/Time:  Friday  September 07 2018 13:49:49 EST Ventricular Rate:  74 PR Interval:    QRS Duration: 89 QT Interval:  393 QTC Calculation: 436 R Axis:  71 Text Interpretation:  Sinus rhythm Probable left atrial enlargement Confirmed by Lennice Sites [phone]) on 09/07/2018 2:44:25 PM   Radiology Dg Chest 2 View  Result Date: 09/07/2018 CLINICAL DATA:  Syncopal episode.  Cough and congestion. EXAM: CHEST - 2 VIEW COMPARISON:  None. FINDINGS: A nodular density over the right base is favored to represent a nipple shadow. No other nodules. No masses. The heart, hila, and mediastinum are normal. No pneumothorax. No other acute abnormalities. IMPRESSION: A nodular density projected over the right base is favored to represent a nipple shadow. Recommend repeat imaging with nipple markers. No other acute abnormalities. Electronically Signed   By: Dorise Bullion III M.D   On: 09/07/2018 16:29    Procedures Procedures (including critical care time)  Medications Ordered in ED Medications  sodium chloride flush (NS) 0.9 % injection 3 mL (3 mLs Intravenous Not Given 09/07/18 1357)  sodium chloride 0.9 % bolus 1,000 mL (0 mLs Intravenous Stopped 09/07/18 1555)     Initial Impression / Assessment and Plan / ED Course  I have reviewed the triage vital signs and the nursing notes.  Pertinent labs & imaging results that were available during my care of the patient were reviewed by me and considered in my medical decision making (see chart for details).     Latoya Robbins is a 53 year old female history of asthma, vasovagal syncope who presents to the ED after near syncopal event.  Patient with normal vitals.  No fever.  Patient was at an event today where she is eating and got lightheaded, flushed, dizzy and felt as though she was going to pass out.  She got nauseous.  Patient was laid down to the ground and she slowly felt better.  When EMS got there she had low blood pressure that improved prior to fluids.  Patient  denies any chest pain, shortness of breath, abdominal pain.  She states that she has had sinus congestion for the last several days.  Has a history of vasovagal syncope with multiple triggers including IV sticks, during her menstrual cycle as well.  She denies any black stools or bloody stools.  No abnormal uterine bleeding.  She has normal neurological exam.  Overall exam is unremarkable.  Patient currently asymptomatic.  EKG showed sinus rhythm.  No signs of ischemic changes. Patient with no chest pain and no cardiac or pulmonary risk factors. Doubt ACS/PE. No urinary symptoms. Patient with no significant anemia, electrolyte abnormality, kidney injury.  Patient negative pregnancy test.  Patient with no leukocytosis.  No significant findings on chest x-ray as well.  No obvious pneumothorax, pleural effusion, pneumonia.  Patient to follow-up closely with primary care doctor for repeat CXR (nipple shadow likely seen, recommend repeat CXR with PCP to ensure not a nodule, patient knows about risk of possible nodule and need for possible future CT scan).  Given Afrin for nasal congestion.  Given return precautions and discharged from ED in good condition.  This chart was dictated using voice recognition software.  Despite best efforts to proofread,  errors can occur which can change the documentation meaning.   Final Clinical Impressions(s) / ED Diagnoses   Final diagnoses:  Near syncope    ED Discharge Orders         Ordered    oxymetazoline (AFRIN NASAL SPRAY) 0.05 % nasal spray  2 times daily     09/07/18 1631           Lennice Sites, DO 09/07/18 1701  Lennice Sites, DO 09/07/18 [place], [place], DO 09/07/18 1710

## 2018-09-11 DIAGNOSIS — R05 Cough: Secondary | ICD-10-CM | POA: Diagnosis not present

## 2018-09-11 DIAGNOSIS — J111 Influenza due to unidentified influenza virus with other respiratory manifestations: Secondary | ICD-10-CM | POA: Diagnosis not present

## 2018-09-11 DIAGNOSIS — J45998 Other asthma: Secondary | ICD-10-CM | POA: Diagnosis not present

## 2018-09-11 DIAGNOSIS — Z6820 Body mass index (BMI) 20.0-20.9, adult: Secondary | ICD-10-CM | POA: Diagnosis not present

## 2018-09-19 DIAGNOSIS — J3081 Allergic rhinitis due to animal (cat) (dog) hair and dander: Secondary | ICD-10-CM | POA: Diagnosis not present

## 2018-09-19 DIAGNOSIS — J4540 Moderate persistent asthma, uncomplicated: Secondary | ICD-10-CM | POA: Diagnosis not present

## 2018-09-19 DIAGNOSIS — J3089 Other allergic rhinitis: Secondary | ICD-10-CM | POA: Diagnosis not present

## 2018-09-19 DIAGNOSIS — J301 Allergic rhinitis due to pollen: Secondary | ICD-10-CM | POA: Diagnosis not present

## 2018-12-06 DIAGNOSIS — D225 Melanocytic nevi of trunk: Secondary | ICD-10-CM | POA: Diagnosis not present

## 2018-12-06 DIAGNOSIS — L570 Actinic keratosis: Secondary | ICD-10-CM | POA: Diagnosis not present

## 2018-12-06 DIAGNOSIS — D2221 Melanocytic nevi of right ear and external auricular canal: Secondary | ICD-10-CM | POA: Diagnosis not present

## 2018-12-06 DIAGNOSIS — D2261 Melanocytic nevi of right upper limb, including shoulder: Secondary | ICD-10-CM | POA: Diagnosis not present

## 2018-12-06 DIAGNOSIS — D2239 Melanocytic nevi of other parts of face: Secondary | ICD-10-CM | POA: Diagnosis not present

## 2018-12-21 DIAGNOSIS — H5213 Myopia, bilateral: Secondary | ICD-10-CM | POA: Diagnosis not present

## 2018-12-21 DIAGNOSIS — H43813 Vitreous degeneration, bilateral: Secondary | ICD-10-CM | POA: Diagnosis not present

## 2019-02-07 ENCOUNTER — Ambulatory Visit
Admission: RE | Admit: 2019-02-07 | Discharge: 2019-02-07 | Disposition: A | Discharge status: Home / Self Care | Payer: 59 | Source: Ambulatory Visit | Attending: Obstetrics and Gynecology | Admitting: Obstetrics and Gynecology

## 2019-02-07 DIAGNOSIS — R921 Mammographic calcification found on diagnostic imaging of breast: Secondary | ICD-10-CM

## 2019-06-14 ENCOUNTER — Other Ambulatory Visit: Payer: Self-pay

## 2019-06-14 DIAGNOSIS — Z20822 Contact with and (suspected) exposure to covid-19: Secondary | ICD-10-CM

## 2019-06-16 LAB — NOVEL CORONAVIRUS, NAA: SARS-CoV-2, NAA: NOT DETECTED

## 2020-01-08 ENCOUNTER — Other Ambulatory Visit: Payer: Self-pay | Admitting: Obstetrics and Gynecology

## 2020-01-08 DIAGNOSIS — R921 Mammographic calcification found on diagnostic imaging of breast: Secondary | ICD-10-CM

## 2020-02-10 ENCOUNTER — Other Ambulatory Visit: Payer: Self-pay

## 2020-02-10 ENCOUNTER — Ambulatory Visit
Admission: RE | Admit: 2020-02-10 | Discharge: 2020-02-10 | Disposition: A | Discharge status: Home / Self Care | Payer: 59 | Source: Ambulatory Visit | Attending: Obstetrics and Gynecology | Admitting: Obstetrics and Gynecology

## 2020-02-10 DIAGNOSIS — R921 Mammographic calcification found on diagnostic imaging of breast: Secondary | ICD-10-CM

## 2020-07-01 IMAGING — MG DIGITAL DIAGNOSTIC UNILATERAL RIGHT MAMMOGRAM WITH TOMO AND CAD
6 of 9 series · 6 of 21 positions shown · non-contrast
Comparison: Previous exam(s).

CLINICAL DATA: Short-term follow-up for probably benign right
breast calcifications.

EXAM:
DIGITAL DIAGNOSTIC UNILATERAL RIGHT MAMMOGRAM WITH CAD AND TOMO

[R CC (1 of 2)]
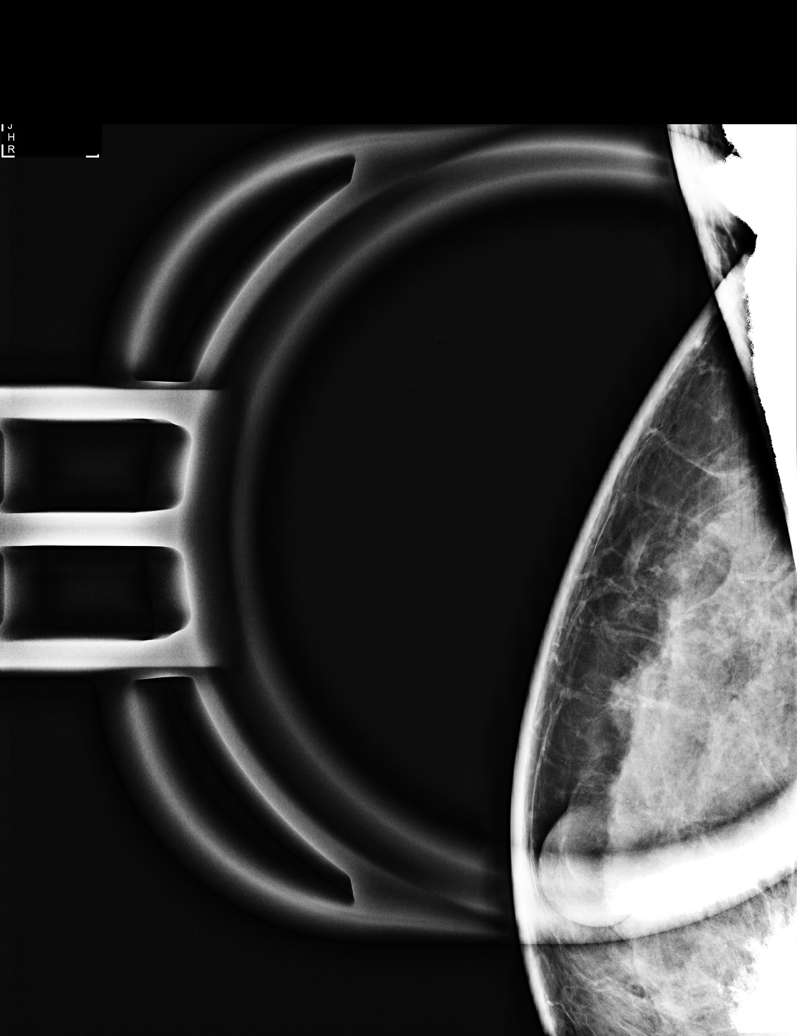

[R ML]
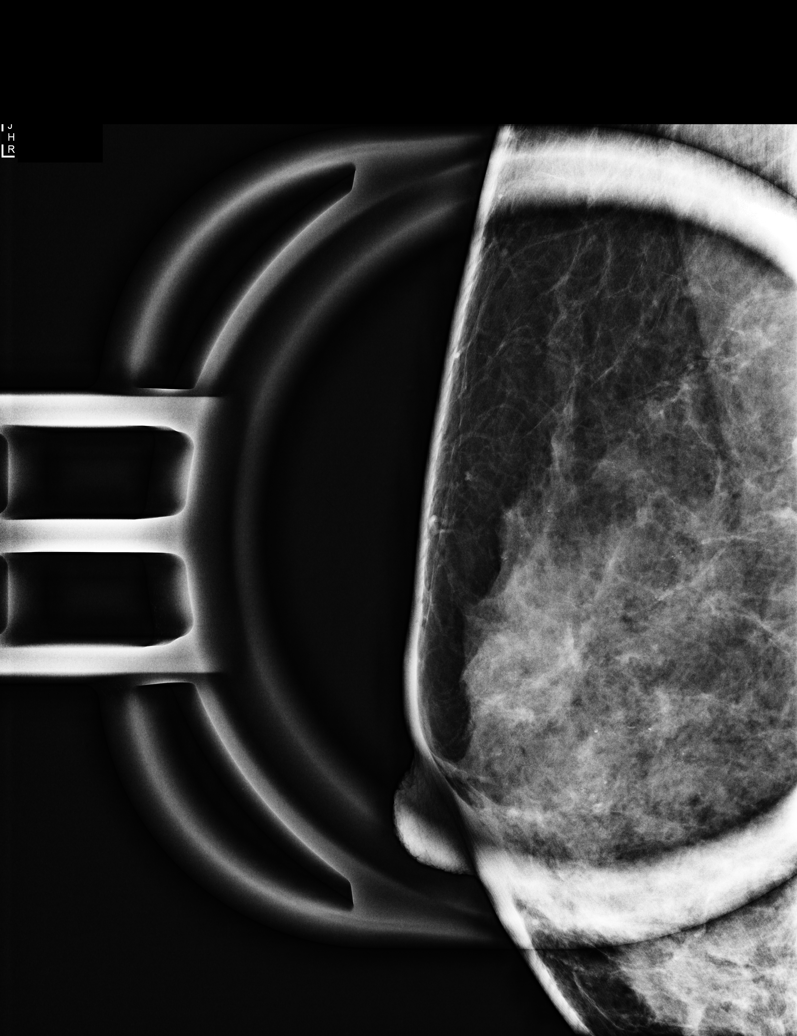

[R CC (2 of 2)]
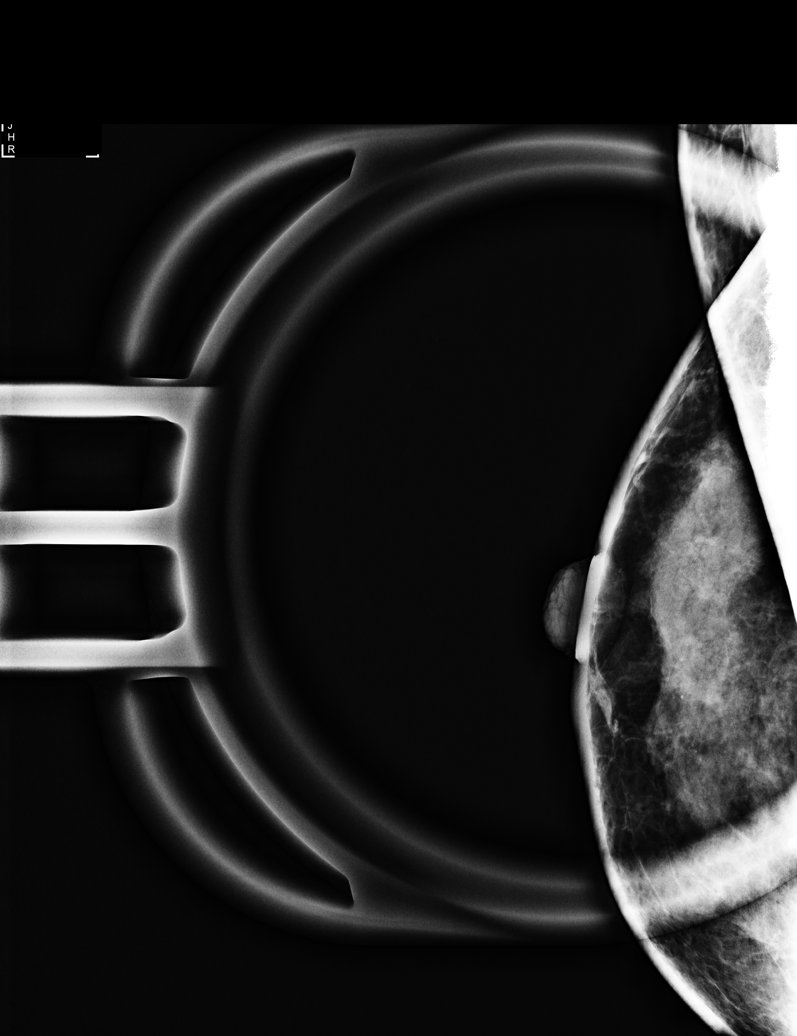

[R CC synth-2D]
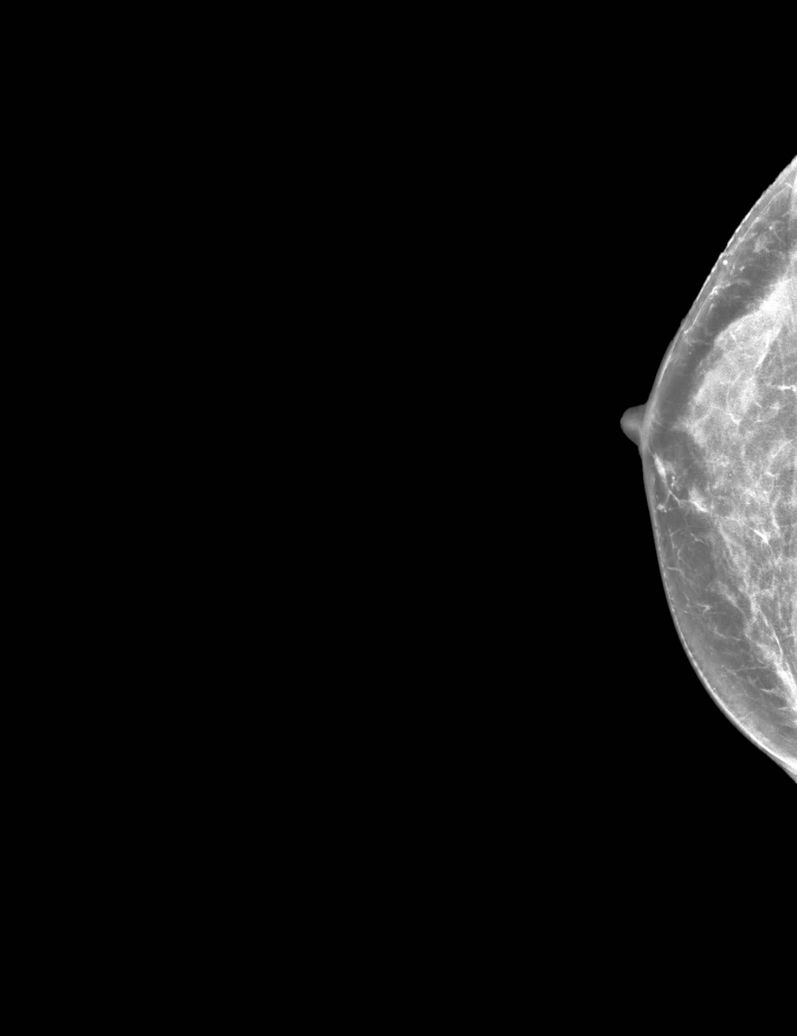

[R MLO synth-2D]
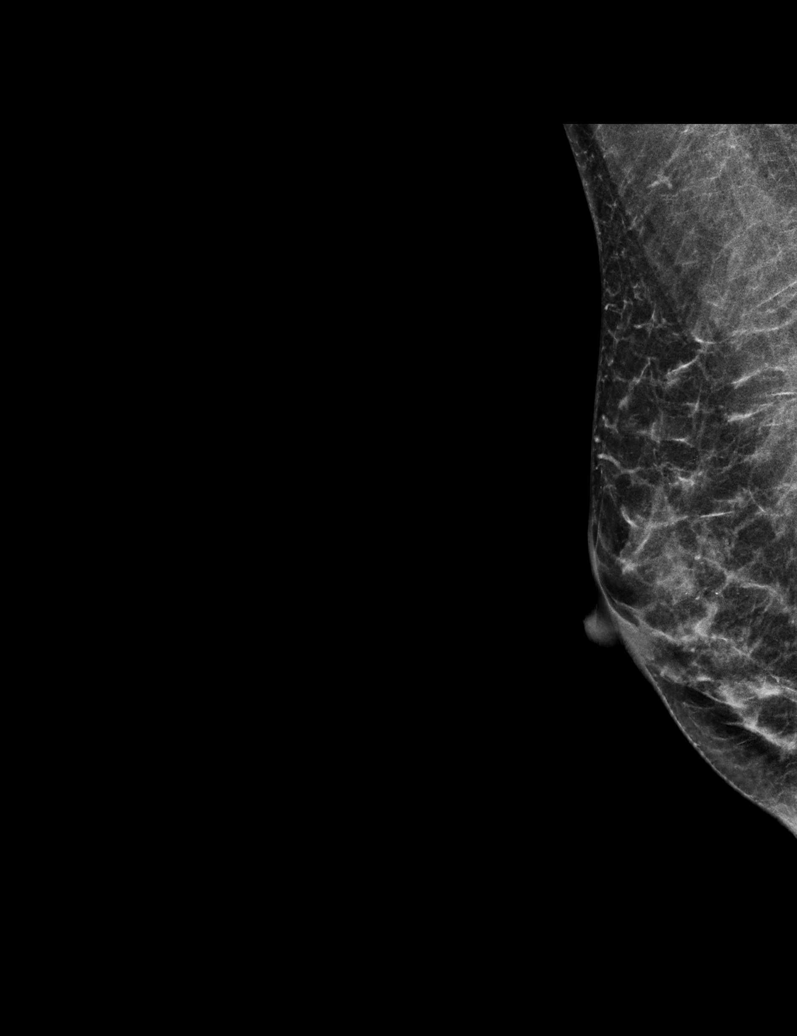

[R XCCL synth-2D]
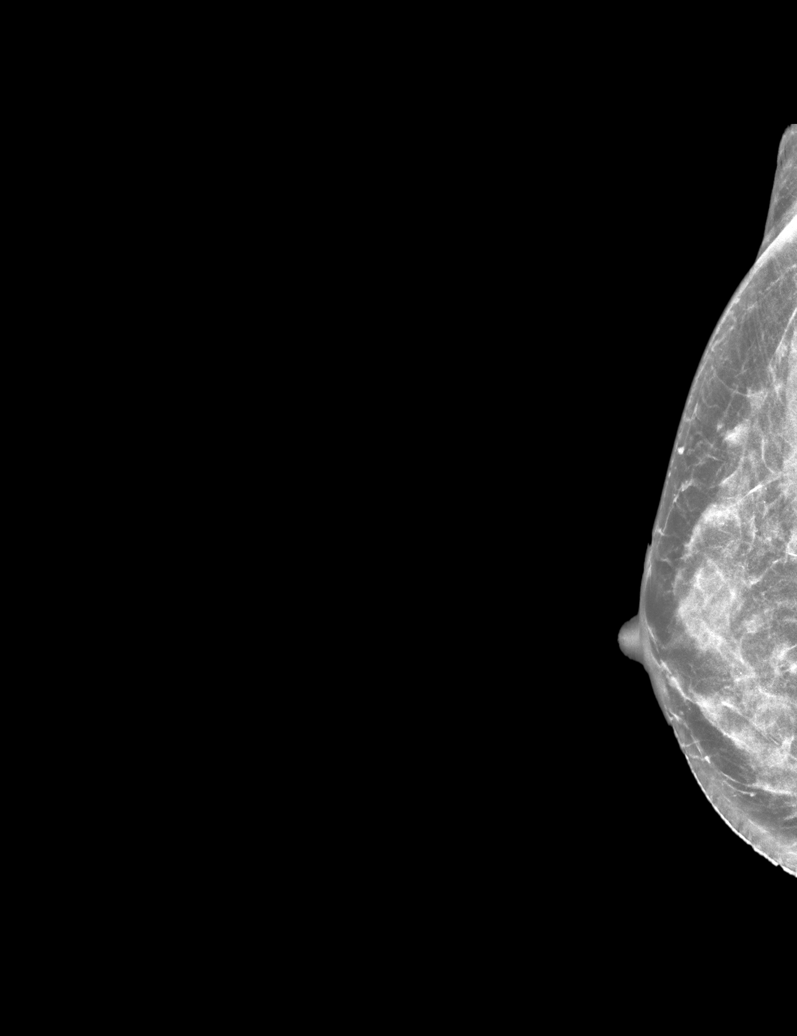

[6 of 21 positions shown; findings below may reference images not displayed]

ACR Breast Density Category c: The breast tissue is heterogeneously
dense, which may obscure small masses.
FINDINGS: No suspicious masses or calcifications are seed in the right breast.
Spot compression magnification views were performed over the central
to upper outer right breast demonstrating predominantly scattered
punctate and round calcifications, a few of which are seen to layer
towards the nipple most compatible with benign milk of calcium.
There is no mammographic evidence of malignancy in the right breast.

Mammographic images were processed with CAD.
IMPRESSION: Stable probably benign right breast calcifications. There is no
mammographic evidence of malignancy in the right breast.

RECOMMENDATION:
Bilateral diagnostic mammography with magnification views of the
right breast which will demonstrate 1 year of stability of the
probably benign right breast calcifications.

I have discussed the findings and recommendations with the patient.
Results were also provided in writing at the conclusion of the
visit. If applicable, a reminder letter will be sent to the patient
regarding the next appointment.

BI-RADS CATEGORY  3: Probably benign.

## 2020-11-02 ENCOUNTER — Other Ambulatory Visit: Payer: Self-pay | Admitting: Obstetrics and Gynecology

## 2020-11-02 DIAGNOSIS — Z1231 Encounter for screening mammogram for malignant neoplasm of breast: Secondary | ICD-10-CM

## 2021-02-10 ENCOUNTER — Ambulatory Visit
Admission: RE | Admit: 2021-02-10 | Discharge: 2021-02-10 | Disposition: A | Discharge status: Home / Self Care | Payer: 59 | Source: Ambulatory Visit | Attending: Obstetrics and Gynecology | Admitting: Obstetrics and Gynecology

## 2021-02-10 ENCOUNTER — Other Ambulatory Visit: Payer: Self-pay

## 2021-02-10 DIAGNOSIS — Z1231 Encounter for screening mammogram for malignant neoplasm of breast: Secondary | ICD-10-CM

## 2021-11-12 ENCOUNTER — Telehealth: Payer: Self-pay

## 2021-11-12 NOTE — Telephone Encounter (Signed)
Increased episodes of syncope. ? ?Autonomic dysfunction. ? ?Please schedule f/u with Dr. Lovena Le per Dr. Lovena Le. ?

## 2021-12-14 ENCOUNTER — Institutional Professional Consult (permissible substitution): Payer: 59 | Admitting: Internal Medicine

## 2022-01-03 ENCOUNTER — Ambulatory Visit: Payer: 59 | Admitting: Internal Medicine

## 2022-01-03 ENCOUNTER — Encounter: Payer: Self-pay | Admitting: Internal Medicine

## 2022-01-03 DIAGNOSIS — R55 Syncope and collapse: Secondary | ICD-10-CM | POA: Insufficient documentation

## 2022-01-03 NOTE — Patient Instructions (Addendum)
Medication Instructions:  Your physician recommends that you continue on your current medications as directed. Please refer to the Current Medication list given to you today.  Labwork: None ordered.  Testing/Procedures: None ordered.  Follow-Up: Your physician wants you to follow-up in: as needed with Cristopher Peru, MD    Any Other Special Instructions Will Be Listed Below (If Applicable).  If you need a refill on your cardiac medications before your next appointment, please call your pharmacy.   Important Information About Sugar

## 2022-01-03 NOTE — Progress Notes (Signed)
HPI Ms. Vest returns today after a long absence from our EP clinic. I saw her last in 2010. She is a pleasant 56 yo woman with a h/o autonomic dysfunction, as well as reactive airway disease, on bronchodilators in the past. She has a h/o syncope remotely but did fairly well until the past few months. In April she passed out at church. No episodes since. She notes that over the past 40 years she has passed out 13 times with most of the episodes associated with her monthly cycle, either just before or during menses. During the last episode, she had worn multiple layers of clothes and then got warm, and started to see spots. She was clammy and then passed out, with a little less than a minute of unresponsiveness.   Allergies  Allergen Reactions   Amoxicillin Hives   Azithromycin Itching   Promethazine Hcl Rash     Current Outpatient Medications  Medication Sig Dispense Refill   albuterol (VENTOLIN HFA) 108 (90 BASE) MCG/ACT inhaler Inhale 2 puffs into the lungs 4 (four) times daily.       escitalopram (LEXAPRO) 10 MG tablet Take 10 mg by mouth daily.     fluticasone furoate-vilanterol (BREO ELLIPTA) 100-25 MCG/ACT AEPB Inhale 1 puff into the lungs daily.     Norethin-Eth Estradiol-Fe [facility] FE PO) Take 1 tablet by mouth daily.       tiotropium (SPIRIVA HANDIHALER) 18 MCG inhalation capsule Place 18 mcg into inhaler and inhale daily.     beclomethasone (QVAR) 40 MCG/ACT inhaler Inhale 2 puffs into the lungs 2 (two) times daily. And rinse mouth 1 Inhaler prn   cetirizine (ZYRTEC) 10 MG tablet Take 10 mg by mouth daily. (Patient not taking: Reported on 01/03/2022)     mometasone-formoterol (DULERA) 100-5 MCG/ACT AERO Inhale 2 puffs into the lungs 2 (two) times daily. (Patient not taking: Reported on 01/03/2022)     montelukast (SINGULAIR) 10 MG tablet Take 10 mg by mouth at bedtime. (Patient not taking: Reported on 01/03/2022)     No current facility-administered medications for this  visit.     Past Medical History:  Diagnosis Date   Allergic rhinitis    Asthma    Hypothyroidism    Syncope     ROS:   All systems reviewed and negative except as noted in the HPI.   Past Surgical History:  Procedure Laterality Date   BREAST EXCISIONAL BIOPSY Left    at age 55 cyst removed   BREAST SURGERY  1983   benign nodule     Family History  Problem Relation Age of Onset   Heart disease Other        uncle   Asthma Other    Uterine cancer Other        grandmother   Diabetes Sister        aunt,uncle     Social History   Socioeconomic History   Marital status: Married    Spouse name: Not on file   Number of children: 3   Years of education: Not on file   Highest education level: Not on file  Occupational History   Occupation: attorney    Comment: [employer]  Tobacco Use   Smoking status: Never   Smokeless tobacco: Not on file  Substance and Sexual Activity   Alcohol use: No   Drug use: No   Sexual activity: Not on file  Other Topics Concern   Not on file  Social History Narrative   Not on file   Social Determinants of Health   Financial Resource Strain: Not on file  Food Insecurity: Not on file  Transportation Needs: Not on file  Physical Activity: Not on file  Stress: Not on file  Social Connections: Not on file  Intimate Partner Violence: Not on file     BP 110/72   Pulse (!) 52   Ht 5' 9.5" (1.765 m)   Wt 145 lb 3.2 oz (65.9 kg)   SpO2 97%   BMI 21.13 kg/m   Physical Exam:  Well appearing NAD HEENT: Unremarkable Neck:  No JVD, no thyromegally Lymphatics:  No adenopathy Back:  No CVA tenderness Lungs:  Clear with no wheezes HEART:  Regular rate rhythm, no murmurs, no rubs, no clicks Abd:  soft, positive bowel sounds, no organomegally, no rebound, no guarding Ext:  2 plus pulses, no edema, no cyanosis, no clubbing Skin:  No rashes no nodules Neuro:  CN II through XII intact, motor grossly intact  EKG -  nsr   Assess/Plan:  Autonomic dysfunction - I have reminded her of the mechanism of her passing out and was discussed strategies to prevent additional occurrences. I recommend generous salt and fluid, limited caffeine and ETOH, and exercise. Also the importance of adequate sleep and not missing a meal were discussed.  Carleene Overlie Willson Lipa,MD

## 2022-01-11 ENCOUNTER — Other Ambulatory Visit: Payer: Self-pay | Admitting: Obstetrics and Gynecology

## 2022-01-11 DIAGNOSIS — Z1231 Encounter for screening mammogram for malignant neoplasm of breast: Secondary | ICD-10-CM

## 2022-03-17 ENCOUNTER — Ambulatory Visit
Admission: RE | Admit: 2022-03-17 | Discharge: 2022-03-17 | Disposition: A | Discharge status: Home / Self Care | Payer: 59 | Source: Ambulatory Visit | Attending: Obstetrics and Gynecology | Admitting: Obstetrics and Gynecology

## 2022-03-17 DIAGNOSIS — Z1231 Encounter for screening mammogram for malignant neoplasm of breast: Secondary | ICD-10-CM

## 2022-11-11 DIAGNOSIS — F411 Generalized anxiety disorder: Secondary | ICD-10-CM | POA: Diagnosis not present

## 2022-11-23 DIAGNOSIS — J45909 Unspecified asthma, uncomplicated: Secondary | ICD-10-CM | POA: Diagnosis not present

## 2022-11-23 DIAGNOSIS — J302 Other seasonal allergic rhinitis: Secondary | ICD-10-CM | POA: Diagnosis not present

## 2022-12-28 DIAGNOSIS — L819 Disorder of pigmentation, unspecified: Secondary | ICD-10-CM | POA: Diagnosis not present

## 2022-12-28 DIAGNOSIS — D2221 Melanocytic nevi of right ear and external auricular canal: Secondary | ICD-10-CM | POA: Diagnosis not present

## 2022-12-28 DIAGNOSIS — L578 Other skin changes due to chronic exposure to nonionizing radiation: Secondary | ICD-10-CM | POA: Diagnosis not present

## 2023-02-09 DIAGNOSIS — N760 Acute vaginitis: Secondary | ICD-10-CM | POA: Diagnosis not present

## 2023-02-09 DIAGNOSIS — R319 Hematuria, unspecified: Secondary | ICD-10-CM | POA: Diagnosis not present

## 2023-02-09 DIAGNOSIS — N3941 Urge incontinence: Secondary | ICD-10-CM | POA: Diagnosis not present

## 2023-02-09 DIAGNOSIS — L710 Perioral dermatitis: Secondary | ICD-10-CM | POA: Diagnosis not present

## 2023-02-14 DIAGNOSIS — H5213 Myopia, bilateral: Secondary | ICD-10-CM | POA: Diagnosis not present

## 2023-02-15 ENCOUNTER — Other Ambulatory Visit: Payer: Self-pay | Admitting: Obstetrics and Gynecology

## 2023-02-15 DIAGNOSIS — Z1231 Encounter for screening mammogram for malignant neoplasm of breast: Secondary | ICD-10-CM

## 2023-04-03 ENCOUNTER — Ambulatory Visit
Admission: RE | Admit: 2023-04-03 | Discharge: 2023-04-03 | Disposition: A | Discharge status: Home / Self Care | Payer: BC Managed Care – PPO | Source: Ambulatory Visit | Attending: Obstetrics and Gynecology | Admitting: Obstetrics and Gynecology

## 2023-04-03 DIAGNOSIS — Z1231 Encounter for screening mammogram for malignant neoplasm of breast: Secondary | ICD-10-CM | POA: Diagnosis not present

## 2023-04-19 DIAGNOSIS — Z124 Encounter for screening for malignant neoplasm of cervix: Secondary | ICD-10-CM | POA: Diagnosis not present

## 2023-04-19 DIAGNOSIS — Z6820 Body mass index (BMI) 20.0-20.9, adult: Secondary | ICD-10-CM | POA: Diagnosis not present

## 2023-04-19 DIAGNOSIS — Z01419 Encounter for gynecological examination (general) (routine) without abnormal findings: Secondary | ICD-10-CM | POA: Diagnosis not present

## 2023-04-20 DIAGNOSIS — Z23 Encounter for immunization: Secondary | ICD-10-CM | POA: Diagnosis not present

## 2023-04-24 DIAGNOSIS — L710 Perioral dermatitis: Secondary | ICD-10-CM | POA: Diagnosis not present

## 2023-04-24 DIAGNOSIS — L814 Other melanin hyperpigmentation: Secondary | ICD-10-CM | POA: Diagnosis not present

## 2023-04-24 DIAGNOSIS — L720 Epidermal cyst: Secondary | ICD-10-CM | POA: Diagnosis not present

## 2023-04-24 DIAGNOSIS — L718 Other rosacea: Secondary | ICD-10-CM | POA: Diagnosis not present

## 2023-05-19 DIAGNOSIS — Z309 Encounter for contraceptive management, unspecified: Secondary | ICD-10-CM | POA: Diagnosis not present

## 2023-06-14 DIAGNOSIS — E039 Hypothyroidism, unspecified: Secondary | ICD-10-CM | POA: Diagnosis not present

## 2023-06-21 DIAGNOSIS — Z0000 Encounter for general adult medical examination without abnormal findings: Secondary | ICD-10-CM | POA: Diagnosis not present

## 2023-06-21 DIAGNOSIS — Z23 Encounter for immunization: Secondary | ICD-10-CM | POA: Diagnosis not present

## 2023-06-21 DIAGNOSIS — Z1339 Encounter for screening examination for other mental health and behavioral disorders: Secondary | ICD-10-CM | POA: Diagnosis not present

## 2023-06-21 DIAGNOSIS — Z1331 Encounter for screening for depression: Secondary | ICD-10-CM | POA: Diagnosis not present

## 2023-06-21 DIAGNOSIS — E039 Hypothyroidism, unspecified: Secondary | ICD-10-CM | POA: Diagnosis not present

## 2023-06-30 DIAGNOSIS — H9012 Conductive hearing loss, unilateral, left ear, with unrestricted hearing on the contralateral side: Secondary | ICD-10-CM | POA: Diagnosis not present

## 2023-07-21 DIAGNOSIS — R319 Hematuria, unspecified: Secondary | ICD-10-CM | POA: Diagnosis not present

## 2023-08-20 ENCOUNTER — Telehealth: Payer: Self-pay | Admitting: Physician Assistant

## 2023-08-20 DIAGNOSIS — R3989 Other symptoms and signs involving the genitourinary system: Secondary | ICD-10-CM

## 2023-08-21 MED ORDER — NITROFURANTOIN MONOHYD MACRO 100 MG PO CAPS: 100.0000 mg | ORAL_CAPSULE | Freq: Two times a day (BID) | ORAL | 0 refills | Status: AC

## 2023-08-21 NOTE — Progress Notes (Signed)

## 2023-10-16 DIAGNOSIS — N390 Urinary tract infection, site not specified: Secondary | ICD-10-CM | POA: Diagnosis not present

## 2024-03-06 DIAGNOSIS — H31002 Unspecified chorioretinal scars, left eye: Secondary | ICD-10-CM | POA: Diagnosis not present

## 2024-03-06 DIAGNOSIS — H43812 Vitreous degeneration, left eye: Secondary | ICD-10-CM | POA: Diagnosis not present

## 2024-03-06 DIAGNOSIS — H5213 Myopia, bilateral: Secondary | ICD-10-CM | POA: Diagnosis not present

## 2024-04-10 DIAGNOSIS — L570 Actinic keratosis: Secondary | ICD-10-CM | POA: Diagnosis not present

## 2024-04-10 DIAGNOSIS — D225 Melanocytic nevi of trunk: Secondary | ICD-10-CM | POA: Diagnosis not present

## 2024-04-10 DIAGNOSIS — D2262 Melanocytic nevi of left upper limb, including shoulder: Secondary | ICD-10-CM | POA: Diagnosis not present

## 2024-04-10 DIAGNOSIS — D2221 Melanocytic nevi of right ear and external auricular canal: Secondary | ICD-10-CM | POA: Diagnosis not present

## 2024-04-10 DIAGNOSIS — D692 Other nonthrombocytopenic purpura: Secondary | ICD-10-CM | POA: Diagnosis not present

## 2024-04-16 DIAGNOSIS — Z23 Encounter for immunization: Secondary | ICD-10-CM | POA: Diagnosis not present

## 2024-04-19 DIAGNOSIS — Z6820 Body mass index (BMI) 20.0-20.9, adult: Secondary | ICD-10-CM | POA: Diagnosis not present

## 2024-04-19 DIAGNOSIS — Z01419 Encounter for gynecological examination (general) (routine) without abnormal findings: Secondary | ICD-10-CM | POA: Diagnosis not present

## 2024-04-19 DIAGNOSIS — Z124 Encounter for screening for malignant neoplasm of cervix: Secondary | ICD-10-CM | POA: Diagnosis not present

## 2024-04-19 DIAGNOSIS — N951 Menopausal and female climacteric states: Secondary | ICD-10-CM | POA: Diagnosis not present

## 2024-04-19 DIAGNOSIS — Z1231 Encounter for screening mammogram for malignant neoplasm of breast: Secondary | ICD-10-CM | POA: Diagnosis not present
